# Patient Record
Sex: Male | Born: 1990 | Race: White | Hispanic: No | Marital: Single
Health system: Midwestern US, Academic
[De-identification: ages and names within clinical notes are randomized; demographics above are authoritative.]

---

## 2022-03-18 ENCOUNTER — Encounter: Admit: 2022-03-18 | Discharge: 2022-03-18 | Payer: 59

## 2022-03-18 NOTE — Telephone Encounter
Patient called to see if there was anyway he could get in to see Dr. [place] sooner. I was able to get the patient's appointment moved up. Patient stated that he did have a cervical MRI at [facility] in [place]. I also sent him a link to complete his [facility] set up and discussed with him the NP paperwork. Patient stated understanding and was agreeable to the new day/time.

## 2022-03-22 ENCOUNTER — Encounter: Admit: 2022-03-22 | Discharge: 2022-03-22 | Payer: 59

## 2022-03-23 ENCOUNTER — Encounter: Admit: 2022-03-23 | Discharge: 2022-03-23 | Payer: 59

## 2022-03-23 DIAGNOSIS — G959 Disease of spinal cord, unspecified: Secondary | ICD-10-CM

## 2022-03-23 NOTE — Patient Instructions
It was nice to see you today.  Thank you for choosing to visit our clinic.  Your time is important, and if you had to wait today, we do apologize.  Our goal is to run exactly on time, however, on occasion, we get behind in clinic due to unexpected patient issues.  We appreciate your patience.    General Instructions:  Scheduling:  Our scheduling phone number is [phone].  How to reach our office:  Please send a MyChart message to the [facility] or leave a voicemail for the nurse, Johnothan Bascomb, RN, BSN at [phone]. Please note messages after 3 PM may not be answered until the next business day.  How to get a medication refill:  Please use the MyChart Refill request or contact your pharmacy directly to request medication refills.  Please allow 72 business hours for your request to be completed.    Support: for many chronic illnesses information is available through [facility] at [email or web address] or [phone].    For help with MyChart:  Please call [phone].    For questions on nights, weekends or holidays:  call the Operator at [phone], and ask for the doctor on call for Neurosurgery.    For more information on spinal conditions:  please visit [email or web address]   Our office fax number is: [phone]    Again, thank you for coming in today and allowing us to take part in your care.      Reda Gettis, RN, BSN  Clinical Nurse Coordinator for Dr. Ifije Ohiorhenuan  Marc A. Asher, MD, [facility]  [facility]  Phone [phone]  Fax [phone]

## 2022-04-06 ENCOUNTER — Encounter: Admit: 2022-04-06 | Discharge: 2022-04-06 | Payer: 59

## 2022-04-06 NOTE — Telephone Encounter
Spoke with patient and he is going to set up his [facility] today and get the NP paperwork completed prior to his appointment on Tuesday.

## 2022-04-12 ENCOUNTER — Ambulatory Visit
Admit: 2022-04-12 | Discharge: 2022-04-12 | Payer: 59 | Primary: Student in an Organized Health Care Education/Training Program

## 2022-04-12 ENCOUNTER — Encounter
Admit: 2022-04-12 | Discharge: 2022-04-12 | Payer: 59 | Primary: Student in an Organized Health Care Education/Training Program

## 2022-04-12 DIAGNOSIS — G959 Disease of spinal cord, unspecified: Secondary | ICD-10-CM

## 2022-04-12 DIAGNOSIS — M5020 Other cervical disc displacement, unspecified cervical region: Secondary | ICD-10-CM

## 2022-04-12 NOTE — Progress Notes
Date of Service: 04/12/2022         Chief complaint    Chief Complaint   Patient presents with   ? New Patient     Neck pain numbness in right shoulder        HPI  Perry Huynh is a 31 y.o. male who presents with a history of acute neck pain here for evaluation.  He reports a several month history of neck pain.  He reports this started incidentally.  He was sleeping and 1 morning when he woke up he had some sharp shooting neck pain started in his neck radiating to his right shoulder.  This pain was quite bothersome for several weeks.  He was prescribed a Medrol dose pack which helped somewhat.  Since then, his symptoms have improved significantly.  He is quite active at baseline and works in [employer].                  Oswestry: Oswestry Total Score:: 14        PMH    No past medical history on file.        ROS  Review of Systems   Review of Systems   All other systems reviewed and are negative.      FH    No family history on file.  Social History     Socioeconomic History   ? Marital status: Single   Tobacco Use   ? Smoking status: Every Day     Packs/day: .5     Types: Cigarettes     Passive exposure: Never   ? Smokeless tobacco: Never           SH    No past surgical history on file.    Meds    No current outpatient medications on file.       Allergies    No Known Allergies      Exam  Physical Exam   Constitutional: he is oriented to person, place, and time. he appears well-developed and well-nourished.    Head: Normocephalic and atraumatic.    Eyes: Conjunctivae and EOM are normal.    Pulmonary/Chest: Effort normal. No respiratory distress.   Neurological: he is alert and oriented to person, place, and time. No cranial nerve deficit or sensory deficit. he exhibits normal muscle tone. Coordination normal.   Skin: Skin is warm and dry.   Psychiatric: he has a normal mood and affect. his behavior is normal. Judgment and thought content normal.    Vitals reviewed.  A&Ox3  FS, TML, EOMI      D B Tr Hg IO  R 5/5 5/5 5/5 5/5 5/5  L 5/5 5/5 5/5 5/5 5/5        HF KE DF PF EHL  R  5/5 5/5 5/5 5/5 5/5  L  5/5 5/5 5/5 5/5 5/5  No Hoffman's reflexes present      Vitals:   Vitals:    04/12/22 1537   BP: (!) 123/98   BP Source: Arm, Right Upper   Pulse: 76   SpO2: 98%   PainSc: Three   Weight: 90.7 kg (200 lb)   Height: 182.9 cm (6')     Body mass index is 27.12 kg/m?.      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                             Please note that documentation of records were done during a busy neurosurgical clinic. Attempts have been made to review the document for any errors. Please excuse for brevity and typographical errors.

## 2022-07-11 ENCOUNTER — Encounter
Admit: 2022-07-11 | Discharge: 2022-07-11 | Payer: 59 | Primary: Student in an Organized Health Care Education/Training Program

## 2023-09-29 ENCOUNTER — Encounter
Admit: 2023-09-29 | Discharge: 2023-09-29 | Payer: PRIVATE HEALTH INSURANCE | Primary: Student in an Organized Health Care Education/Training Program

## 2023-12-03 IMAGING — MR SPCERVWO
6 of 9 series · 27 of 48 positions shown · non-contrast
Comparison: none

[Series 5: T2 · sagittal · 3.0mm · 0.69mm/px · 3 of 17 slices shown (1 of 2)]
[im 1/17]
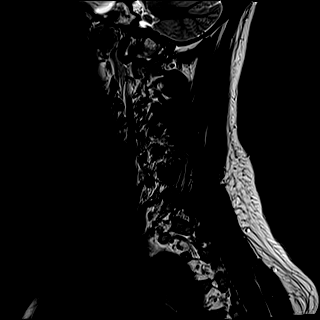
[im 9/17]
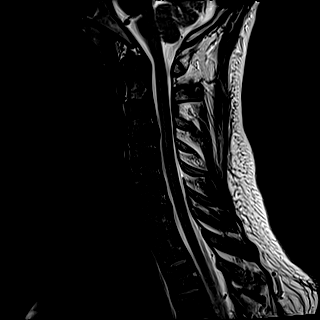
[im 17/17]
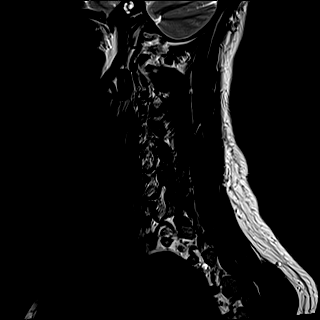

[Series 7: STIR · sagittal · 3.0mm · 0.86mm/px · 3 of 17 slices shown]
[im 1/17]
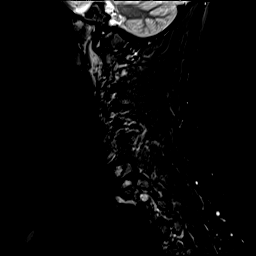
[im 9/17]
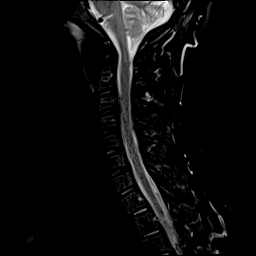
[im 17/17]
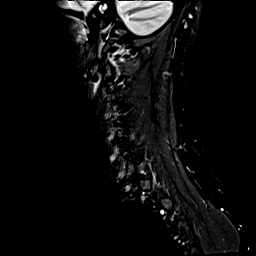

[Series 8: T1 · sagittal · 3.0mm · 0.43mm/px · 3 of 17 slices shown (1 of 2)]
[im 1/17]
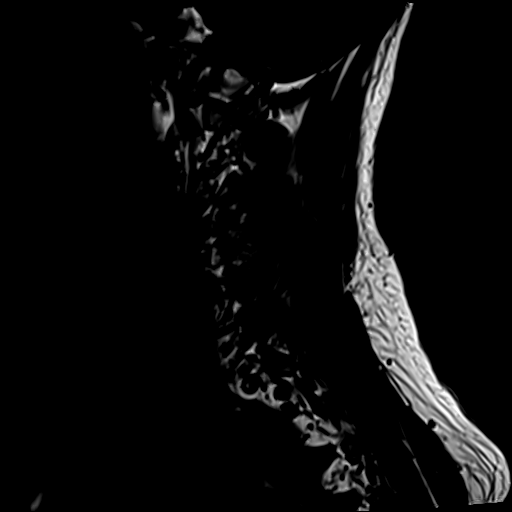
[im 9/17]
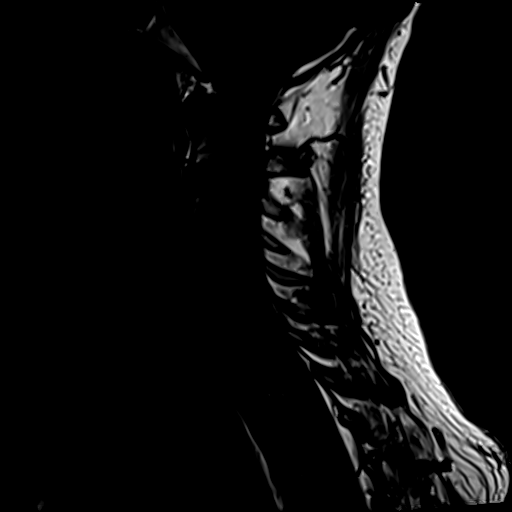
[im 17/17]
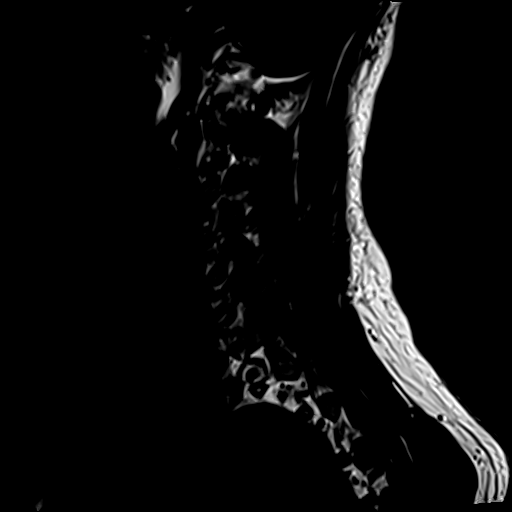

[Series 9: T2 · axial · 3.0mm · 0.70mm/px · z∈[-143,-20]mm · 6 of 37 slices shown (2 of 2)]
[im 1/37]
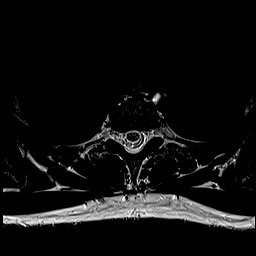
[im 8/37]
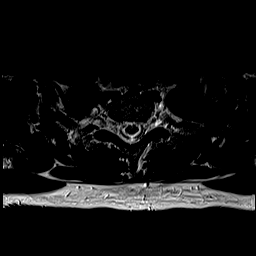
[im 15/37]
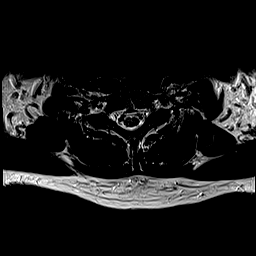
[im 22/37]
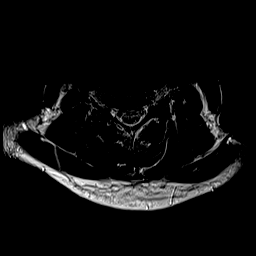
[im 29/37]
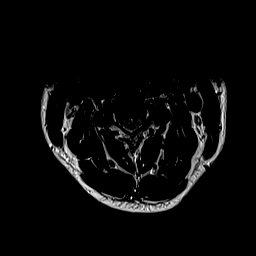
[im 37/37]
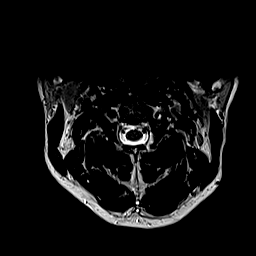

[Series 10: GRE · axial · 3.0mm · 0.47mm/px · z∈[-132,-20]mm · 6 of 36 slices shown]
[im 1/36]
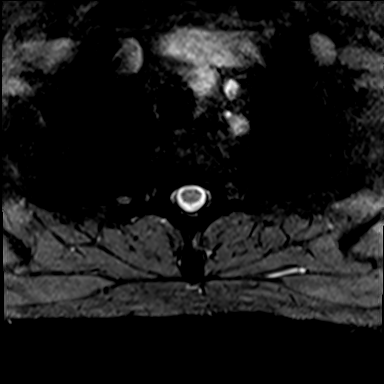
[im 8/36]
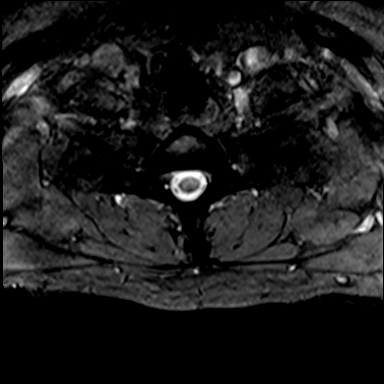
[im 15/36]
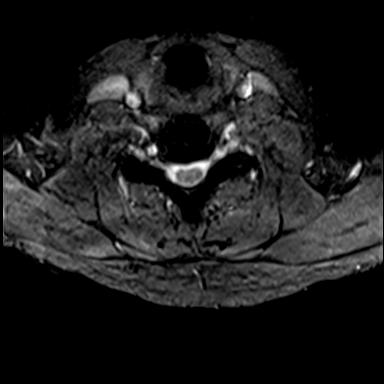
[im 22/36]
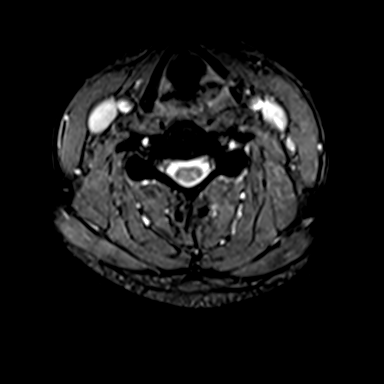
[im 29/36]
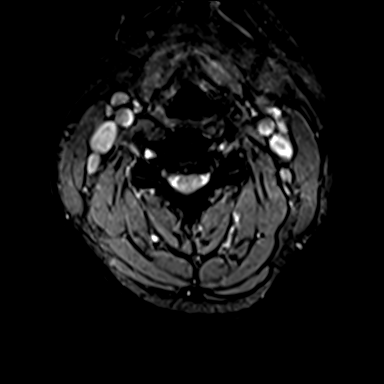
[im 36/36]
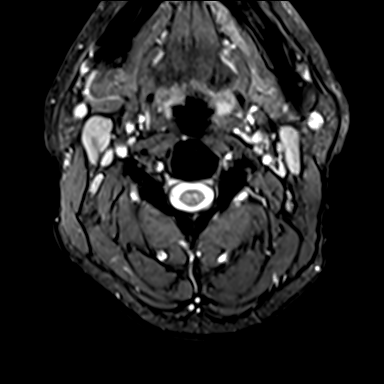

[Series 11: T1 · axial · 3.0mm · 0.35mm/px · z∈[-138,-27]mm · 6 of 36 slices shown (2 of 2)]
[im 1/36]
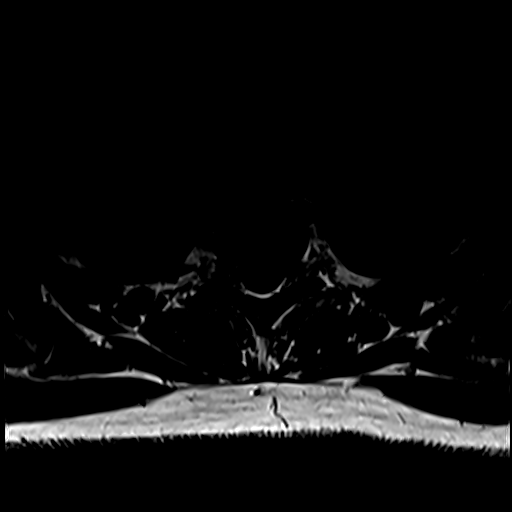
[im 8/36]
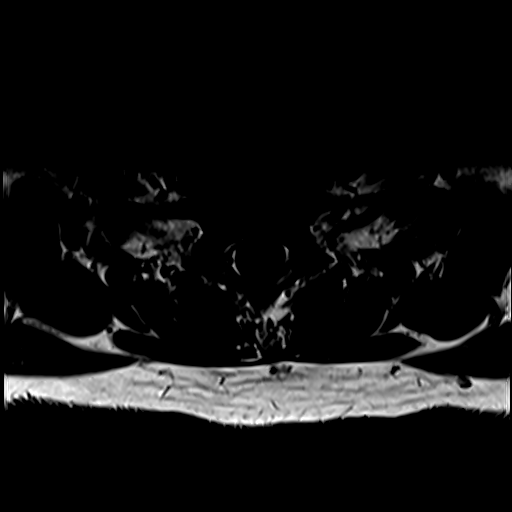
[im 15/36]
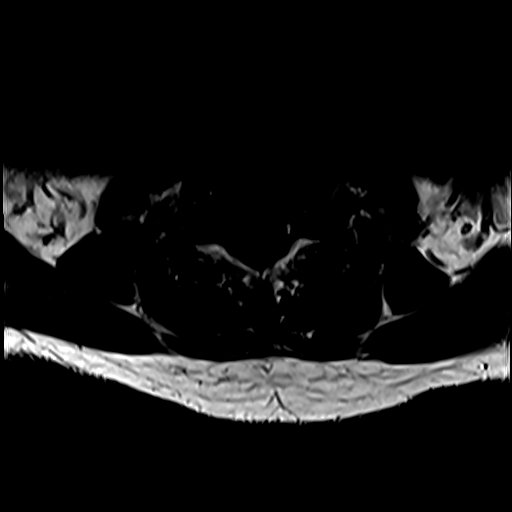
[im 22/36]
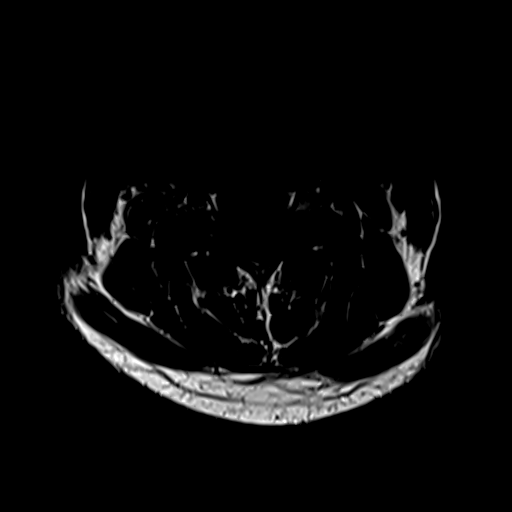
[im 29/36]
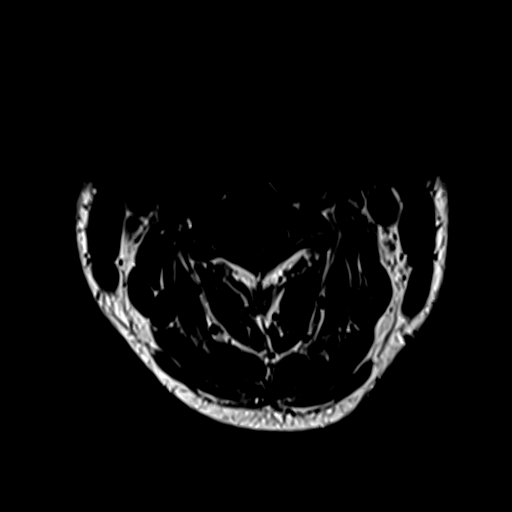
[im 36/36]
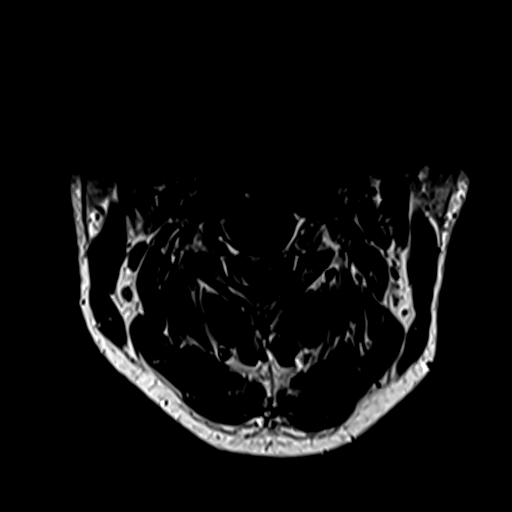

[27 of 48 positions shown; findings below may reference images not displayed]

EXAM

MAGNETIC RESONANCE IMAGING, SPINAL CANAL AND CONTENTS, CERVICAL; WITHOUT CONTRAST MATERIAL, CPT

INDICATION

Neck pain and numbness extending into the right shoulder.

COMPARISONS

X-ray cervical 02/25/2022.

FINDINGS

Subtle loss of normal cervical lordosis. Normal vertebral height. The marrow signal is slightly
heterogeneous but is likely within normal limits. No suspicious marrow lesions. No significant
marrow edema. No acute displaced fracture or dislocation. The cervical cord is normal in signal and
caliber. There is no cord compression. The posterior cranial fossa appears grossly unremarkable.

C2-3: No disc bulge.

C3-4: Right lateral disc protrusion with moderate right-sided neural foramen stenosis.

C4-5: No disc bulge.

C5-6: No disc bulge.

C6-7: No disc bulge.

C7-T1: No disc bulge.

There is a 1.1 cm right-sided level 2 node.

IMPRESSION

C3-4: Right lateral disc protrusion with moderate right-sided neural foramen stenosis.

Follow-up with a neurosurgical consultation can be performed.

Tech Notes:

pain to rt side of neck x 1 month, now with radiation to rt shoulder.  rg
